# Patient Record
Sex: Male | Born: 1980 | Hispanic: No | Marital: Married | Smoking: Never smoker
Health system: Southern US, Community
[De-identification: ages and names within clinical notes are randomized; demographics above are authoritative.]

---

## 2000-09-19 ENCOUNTER — Emergency Department (HOSPITAL_COMMUNITY): Admission: EM | Admit: 2000-09-19 | Discharge: 2000-09-19 | Payer: Self-pay | Admitting: Emergency Medicine

## 2000-09-19 ENCOUNTER — Encounter: Payer: Self-pay | Admitting: *Deleted

## 2000-09-19 ENCOUNTER — Encounter: Payer: Self-pay | Admitting: Emergency Medicine

## 2003-10-11 ENCOUNTER — Emergency Department (HOSPITAL_COMMUNITY): Admission: EM | Admit: 2003-10-11 | Discharge: 2003-10-11 | Payer: Self-pay | Admitting: Family Medicine

## 2015-02-01 ENCOUNTER — Emergency Department (INDEPENDENT_AMBULATORY_CARE_PROVIDER_SITE_OTHER): Payer: BLUE CROSS/BLUE SHIELD

## 2015-02-01 ENCOUNTER — Encounter (HOSPITAL_COMMUNITY): Payer: Self-pay | Admitting: Emergency Medicine

## 2015-02-01 ENCOUNTER — Emergency Department (INDEPENDENT_AMBULATORY_CARE_PROVIDER_SITE_OTHER)
Admission: EM | Admit: 2015-02-01 | Discharge: 2015-02-01 | Disposition: A | Discharge status: Home / Self Care | Payer: BLUE CROSS/BLUE SHIELD | Source: Home / Self Care | Attending: Emergency Medicine | Admitting: Emergency Medicine

## 2015-02-01 DIAGNOSIS — S8990XA Unspecified injury of unspecified lower leg, initial encounter: Secondary | ICD-10-CM | POA: Diagnosis not present

## 2015-02-01 DIAGNOSIS — J309 Allergic rhinitis, unspecified: Secondary | ICD-10-CM

## 2015-02-01 DIAGNOSIS — S8991XA Unspecified injury of right lower leg, initial encounter: Secondary | ICD-10-CM

## 2015-02-01 DIAGNOSIS — S99929A Unspecified injury of unspecified foot, initial encounter: Secondary | ICD-10-CM

## 2015-02-01 DIAGNOSIS — S99919A Unspecified injury of unspecified ankle, initial encounter: Secondary | ICD-10-CM

## 2015-02-01 MED ORDER — MOMETASONE FUROATE 50 MCG/ACT NA SUSP: 2.0000 | Freq: Every day | NASAL | Status: AC

## 2015-02-01 MED ORDER — TRAMADOL HCL 50 MG PO TABS: 50.0000 mg | ORAL_TABLET | Freq: Four times a day (QID) | ORAL | Status: AC | PRN

## 2015-02-01 MED ORDER — DESLORATADINE 5 MG PO TABS: 5.0000 mg | ORAL_TABLET | Freq: Every day | ORAL | Status: AC

## 2015-02-01 NOTE — ED Notes (Signed)
Knee sprain and sinus complaints.  Onset yesterday

## 2015-02-01 NOTE — Discharge Instructions (Signed)
You have some sort of injury to your right knee that is causing swelling in the knee. With the knee immobilizer if you are going to be up and moving around. Use crutches and do not put weight on the right leg. If you are going to be sitting, you can remove the knee immobilizer. Apply ice to help with the swelling. Do gentle flexion and extension to keep your knee loose. You can take ibuprofen 3 times a day to help with the swelling. Use the tramadol every 6-8 hours as needed for severe pain. Do not drive while taking this medicine. Please call Dr. Eliberto IvoryBlackman's office tomorrow morning to set up an appointment in the next 1-2 weeks.  Start the Nasonex and desloratadine for your sinus symptoms. You should see improvement in the next 2-3 days.

## 2015-02-01 NOTE — ED Provider Notes (Signed)
CSN: [id number]     Arrival date & time 02/01/15  1350 History   First MD Initiated Contact with Patient 02/01/15 1440     Chief Complaint  Patient presents with  . Knee Pain   (Consider location/radiation/quality/duration/timing/severity/associated sxs/prior Treatment) HPI  He is a 34 year old man here for evaluation of right knee pain. He states he was playing soccer yesterday when he jumped. When he landed he felt something pop in his right knee. He had immediate pain and swelling. There is also some bruising on the medial aspect of the knee. He denies any locking or clicking. No looseness of the joint. He states he can walk on it, but it is very painful.  He has applied ice which helped somewhat the swelling. He has also taken a little bit of ibuprofen.  He also mentions some trouble with his sinuses. He states every year he gets sinus pressure and congestion in the summer and fall. Is associated with a headache. He has had sinus pressure for the last several weeks. He does not take any medication regularly for this. He will take ibuprofen for the headache, which does help. He was seen at another urgent care a year ago for this and treated with a course of antibiotics which he states did help.  No fevers or chills.  History reviewed. No pertinent past medical history. History reviewed. No pertinent past surgical history. No family history on file. Social History  Substance Use Topics  . Smoking status: Never Smoker   . Smokeless tobacco: None  . Alcohol Use: No    Review of Systems As in history of present illness Allergies  Review of patient's allergies indicates no known allergies.  Home Medications   Prior to Admission medications   Medication Sig Start Date End Date Taking? Authorizing Provider  ibuprofen (ADVIL,MOTRIN) 200 MG tablet Take 200 mg by mouth every 6 (six) hours as needed.   Yes Historical Provider, MD  desloratadine (CLARINEX) 5 MG tablet Take 1 tablet (5 mg  total) by mouth daily. 02/01/15   Charm RingsErin J Cary Wilford, MD  mometasone (NASONEX) 50 MCG/ACT nasal spray Place 2 sprays into the nose daily. 02/01/15   Charm RingsErin J Princeston Blizzard, MD  traMADol (ULTRAM) 50 MG tablet Take 1 tablet (50 mg total) by mouth every 6 (six) hours as needed. 02/01/15   Charm RingsErin J Yifan Auker, MD   Meds Ordered and Administered this Visit  Medications - No data to display  BP 137/81 mmHg  Pulse 57  Temp(Src) 98.4 F (36.9 C) (Oral)  Resp 18  SpO2 100% No data found.   Physical Exam  Constitutional: He is oriented to person, place, and time. He appears well-developed and well-nourished. No distress.  HENT:  Mouth/Throat: No oropharyngeal exudate.  Small amount of clear postnasal drainage seen. Nasal mucosa is boggy. Mild sinus tenderness.  Neck: Neck supple.  Cardiovascular: Normal rate.   Pulmonary/Chest: Effort normal.  Musculoskeletal:  Right knee: There is significant swelling to the right knee. There is bruising at the medial aspect of the knee. No appreciable joint laxity. MCL, LCL, and anterior cruciate ligament all appear to be intact. Negative McMurray's.  Neurological: He is alert and oriented to person, place, and time.    ED Course  Procedures (including critical care time)  Labs Review Labs Reviewed - No data to display  Imaging Review Dg Knee Complete 4 Views Right  02/01/2015  CLINICAL DATA:  Initial encounter for Pt was playing scoccer yesterday and came down on his  leg wrong and heard a pop in his rt knee, swelling and pain all around the knee EXAM: RIGHT KNEE - COMPLETE 4+ VIEW COMPARISON:  None. FINDINGS: No acute fracture or dislocation. Small suprapatellar joint effusion. Joint spaces are maintained for age. IMPRESSION: Small suprapatellar joint effusion.  No acute osseous abnormality. Electronically Signed   By: Jeronimo Greaves M.D.   On: 02/01/2015 15:17     MDM   1. Right knee injury, initial encounter   2. Allergic sinusitis    Spoke with Dr. Magnus Ivan in  orthopedic surgery who will follow up with the patient. Knee immobilizer and crutches when up and around. Discussed range of motion exercises several times a day. Recommended ice and ibuprofen to help with the swelling. Prescription for tramadol given for severe pain.  We'll treat allergic sinusitis with Nasonex and desloratadine as these are preferred by his insurance company. Follow-up as needed.    Charm Rings, MD 02/01/15 1539

## 2015-02-05 ENCOUNTER — Other Ambulatory Visit: Payer: Self-pay | Admitting: Orthopaedic Surgery

## 2015-02-05 DIAGNOSIS — M25561 Pain in right knee: Secondary | ICD-10-CM

## 2015-02-17 ENCOUNTER — Other Ambulatory Visit: Payer: Self-pay

## 2015-07-21 ENCOUNTER — Other Ambulatory Visit (INDEPENDENT_AMBULATORY_CARE_PROVIDER_SITE_OTHER): Payer: Self-pay | Admitting: Otolaryngology

## 2015-07-21 DIAGNOSIS — J0101 Acute recurrent maxillary sinusitis: Secondary | ICD-10-CM

## 2015-08-03 ENCOUNTER — Ambulatory Visit
Admission: RE | Admit: 2015-08-03 | Discharge: 2015-08-03 | Disposition: A | Discharge status: Home / Self Care | Payer: BLUE CROSS/BLUE SHIELD | Source: Ambulatory Visit | Attending: Otolaryngology | Admitting: Otolaryngology

## 2015-08-03 DIAGNOSIS — J0101 Acute recurrent maxillary sinusitis: Secondary | ICD-10-CM

## 2017-04-07 ENCOUNTER — Telehealth: Payer: Self-pay | Admitting: Rheumatology

## 2017-04-07 NOTE — Telephone Encounter (Signed)
Patient is calling concerning Prednisone prescription.  Stated she received a 5 day supply and was told she would be taking it for 6 weeks.  Contact [phone]  Please advise

## 2017-04-07 NOTE — Telephone Encounter (Signed)
Patient does not Dr. Corliss Skainseveshwar

## 2017-04-12 NOTE — Telephone Encounter (Signed)
ERROR, this message was not for this patient, I have entered in the correct chart and forwarded.

## 2017-06-20 IMAGING — CT CT MAXILLOFACIAL W/O CM
1 series · 15 of 30 positions shown, 19 images · non-contrast
Comparison: None.

CLINICAL DATA: 34-year-old male with right greater than left facial
pain and pressure. Recurrent maxillary sinusitis. Preoperative
surgical planning. Initial encounter.

EXAM:
CT MAXILLOFACIAL WITHOUT CONTRAST
TECHNIQUE: Multidetector CT imaging of the maxillofacial structures was
performed. Multiplanar CT image reconstructions were also generated.
A small metallic BB was placed on the right temple in order to
reliably differentiate right from left.

[Series 4: soft tissue · axial · 0.45mm/px · z∈[-264,-103]mm · 15 of 173 slices shown, 19 images]
[im 6/173  brain]
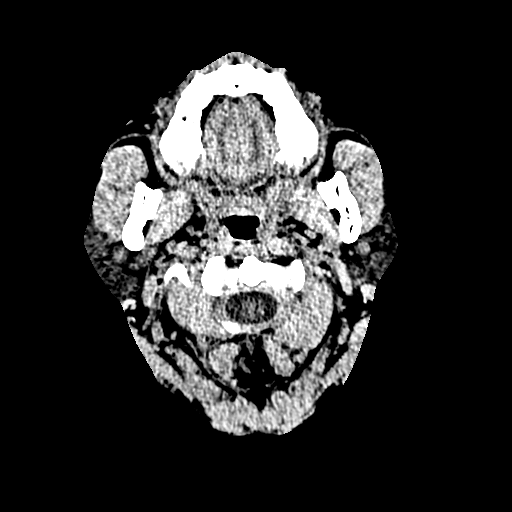
[im 6/173  bone]
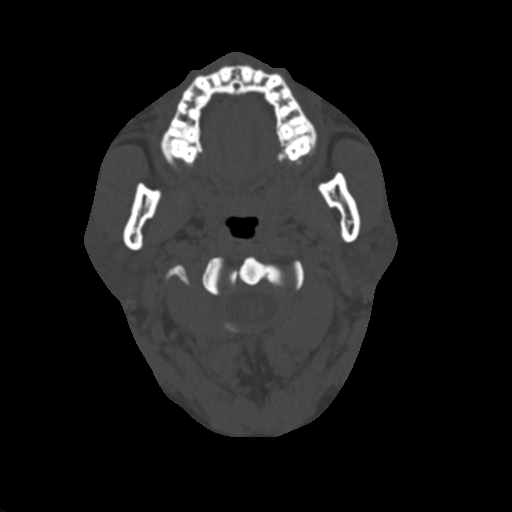
[im 18/173  bone]
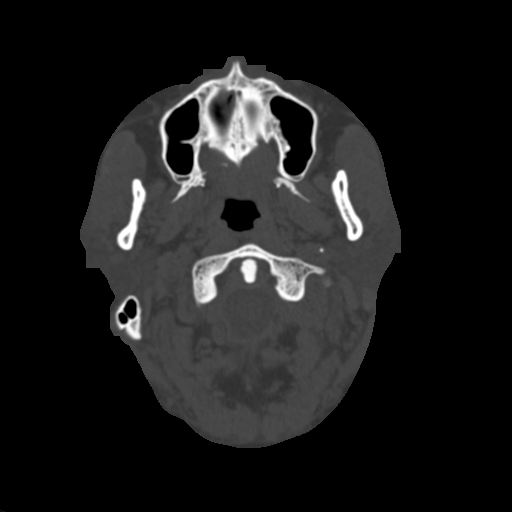
[im 30/173  bone]
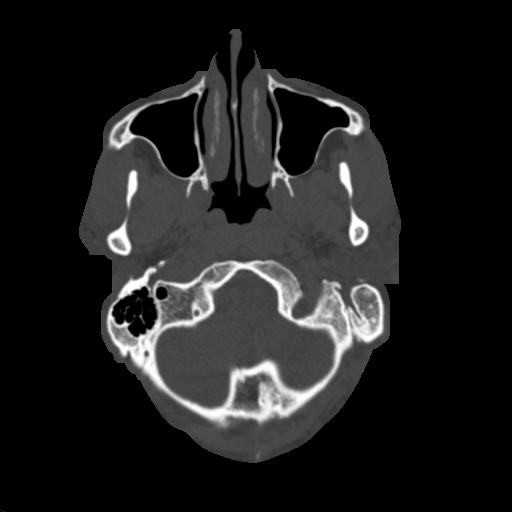
[im 42/173  bone]
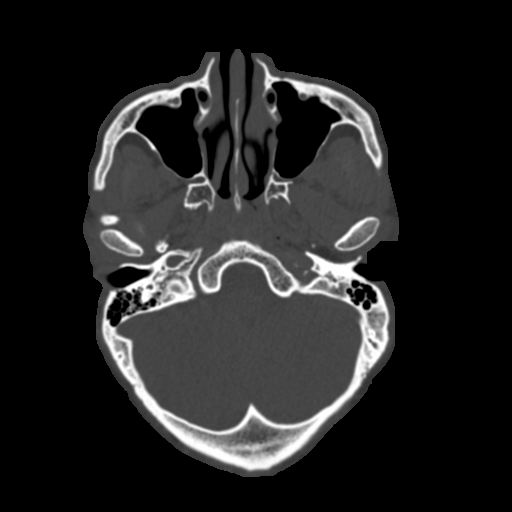
[im 54/173  brain]
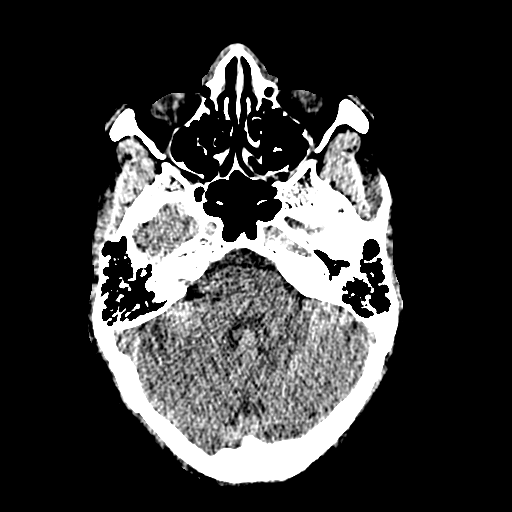
[im 54/173  bone]
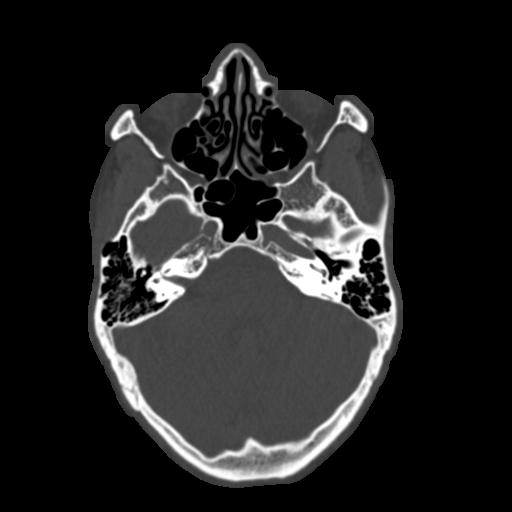
[im 66/173  bone]
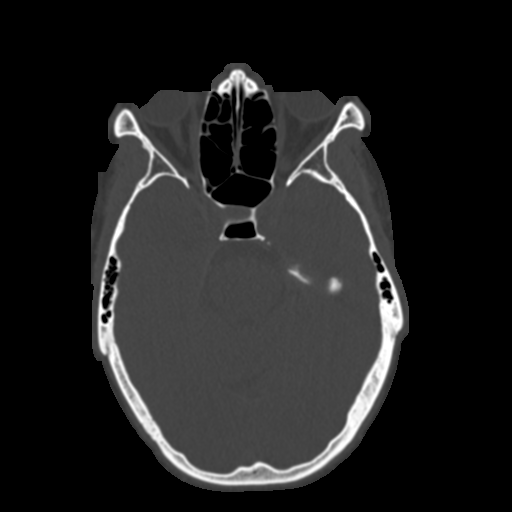
[im 78/173  bone]
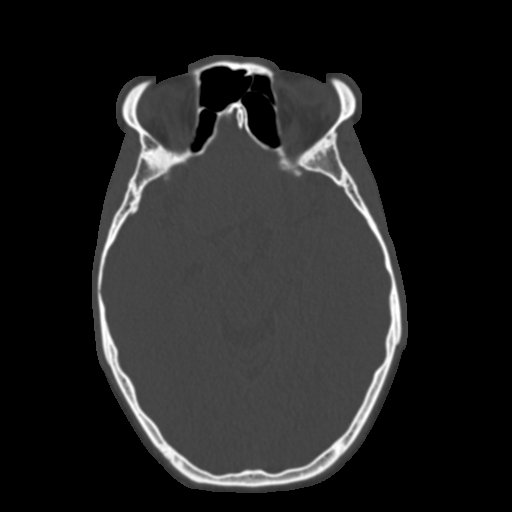
[im 89/173  bone]
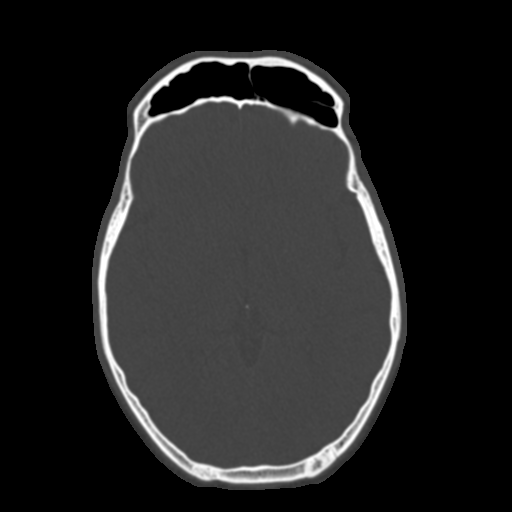
[im 95/173  brain]
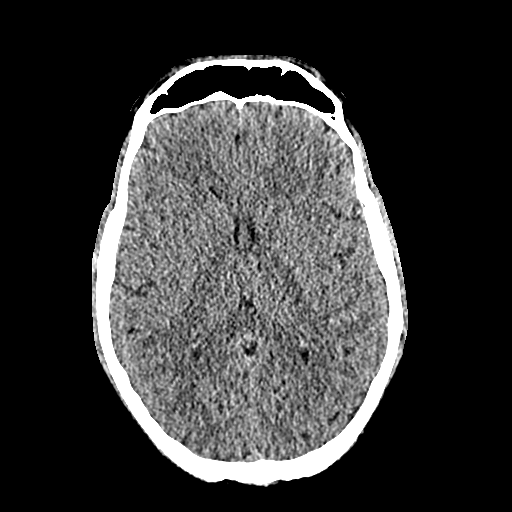
[im 95/173  bone]
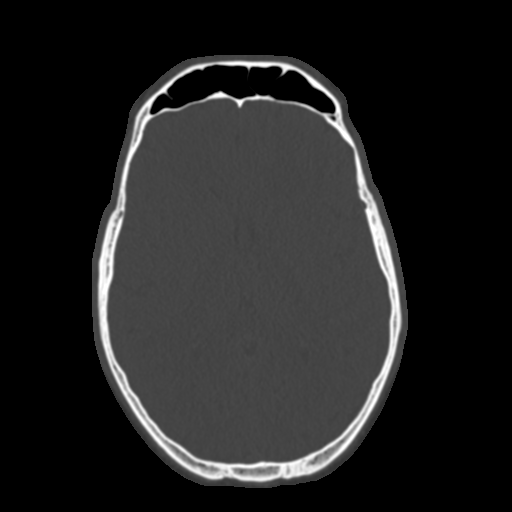
[im 107/173  bone]
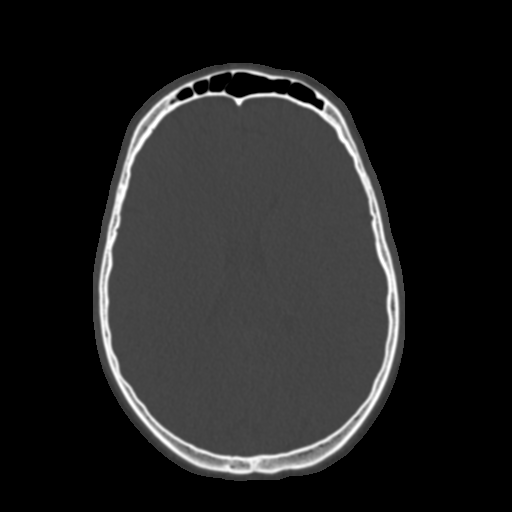
[im 119/173  bone]
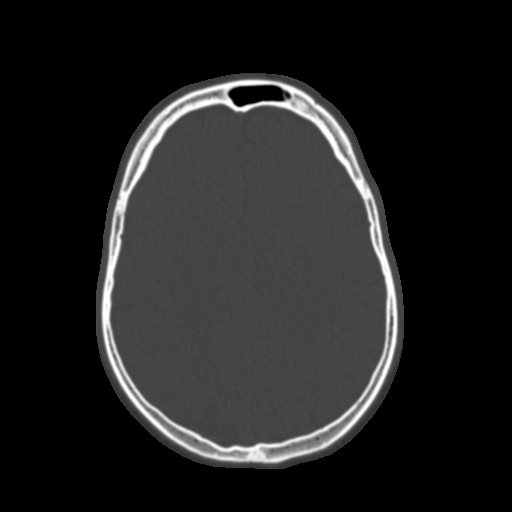
[im 131/173  bone]
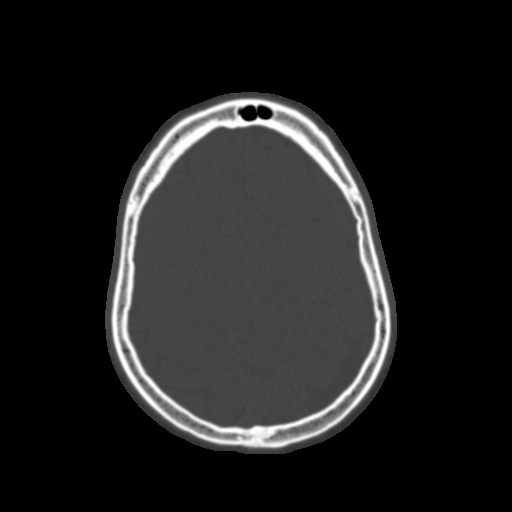
[im 143/173  brain]
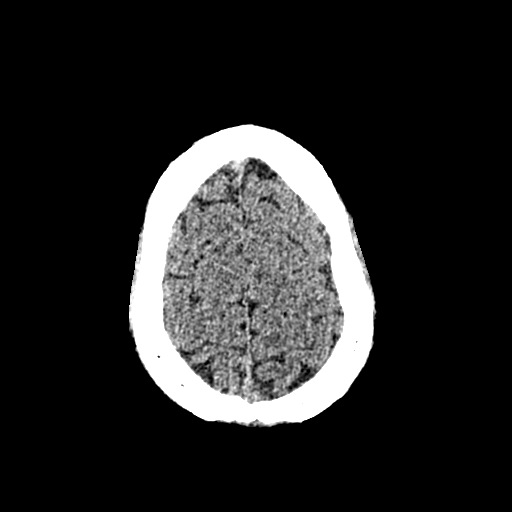
[im 143/173  bone]
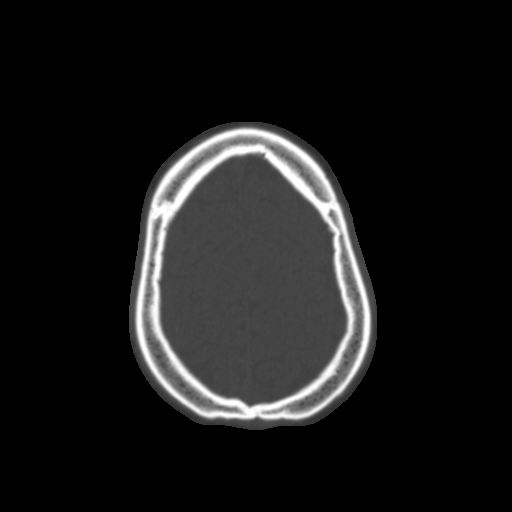
[im 155/173  bone]
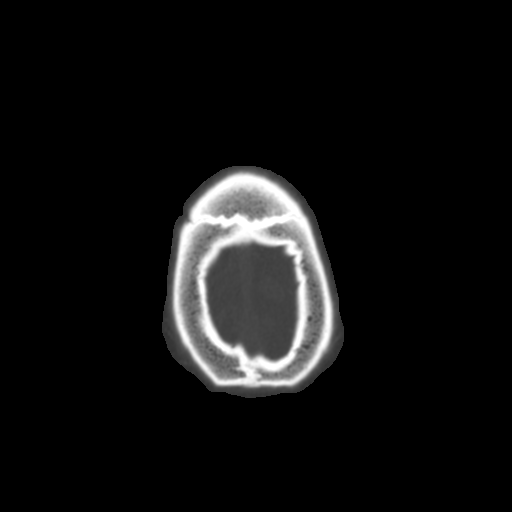
[im 167/173  bone]
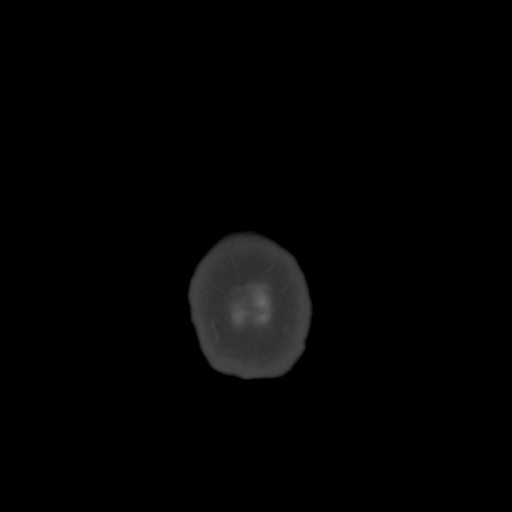

[15 of 30 positions shown; findings below may reference images not displayed]

FINDINGS: Cerebral volume is within normal limits. Negative visualized
noncontrast brain parenchyma.

Negative orbit and scalp soft tissues. Negative visualized
noncontrast deep soft tissue spaces of the face.

Bilateral tympanic cavities and mastoids are clear.

Both sphenoid sinuses are clear. The left sphenoid is larger, but
both sphenoid sinuses are hyperplastic, note anterior clinoid
process pneumatization on the right (coronal image 39).

Hyperplastic appearing bilateral ethmoid air cells are clear.

Hyperplastic frontal sinuses are clear.
Mildly hyperplastic maxillary sinuses are clear aside from minimal
mucosal thickening at both maxillary ostia, with preserved patency
of the OMCs (coronal image 22).

Rightward nasal septal deviation. Symmetric nasal cavity mucosal
thickening. Trace retained secretions in the left nasal cavity.

No osseous abnormality identified.
IMPRESSION: 1. Symmetric appearing nasal cavity mucosal thickening, with trace
retained secretions on the left, suggesting rhinitis.
2. Paranasal sinuses are hyperplastic and clear.

## 2021-06-11 ENCOUNTER — Other Ambulatory Visit: Payer: Self-pay | Admitting: Family Medicine

## 2021-06-11 DIAGNOSIS — N50819 Testicular pain, unspecified: Secondary | ICD-10-CM

## 2021-06-15 ENCOUNTER — Ambulatory Visit
Admission: RE | Admit: 2021-06-15 | Discharge: 2021-06-15 | Disposition: A | Discharge status: Home / Self Care | Payer: Managed Care, Other (non HMO) | Source: Ambulatory Visit | Attending: Family Medicine | Admitting: Family Medicine

## 2021-06-15 DIAGNOSIS — N50819 Testicular pain, unspecified: Secondary | ICD-10-CM

## 2023-05-03 IMAGING — US US SCROTUM W/ DOPPLER COMPLETE
1 series · 13 of 25 positions shown · non-contrast
Comparison: None.

CLINICAL DATA: Left testicular pain

EXAM:
SCROTAL ULTRASOUND
DOPPLER ULTRASOUND OF THE TESTICLES
TECHNIQUE: Complete ultrasound examination of the testicles, epididymis, and
other scrotal structures was performed. Color and spectral Doppler
ultrasound were also utilized to evaluate blood flow to the
testicles.

[Series 1: us scrotum w/ doppler complete · 0.05mm/px · 13 of 47 slices shown]
[im 1/47]
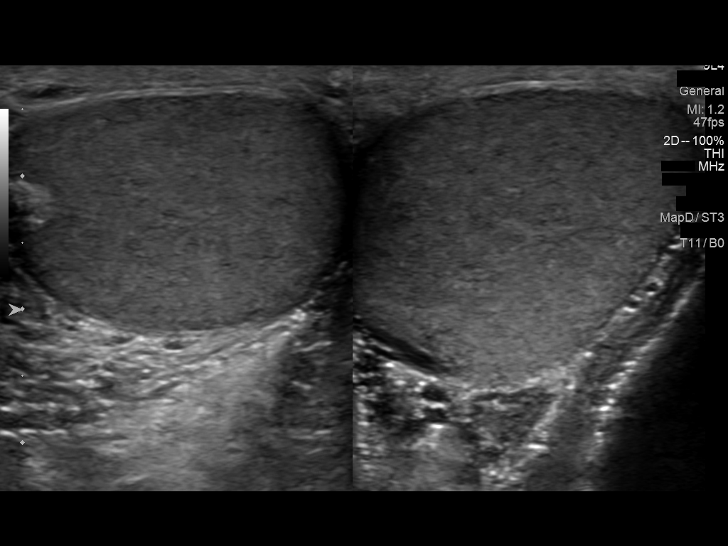
[im 4/47]
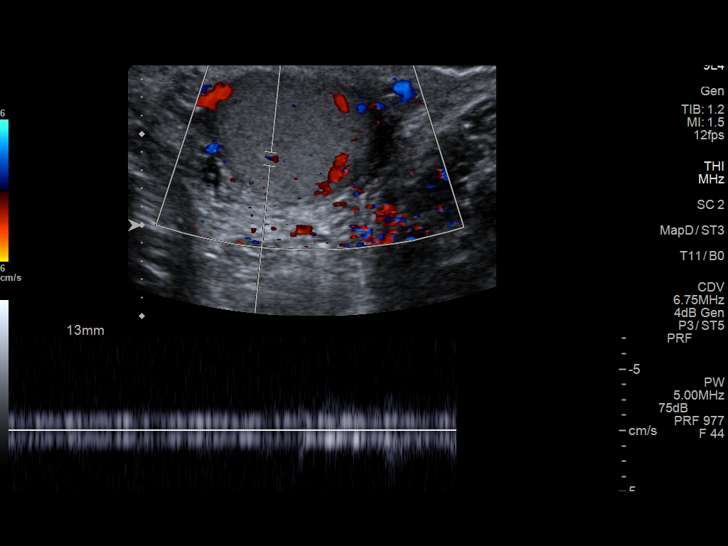
[im 8/47]
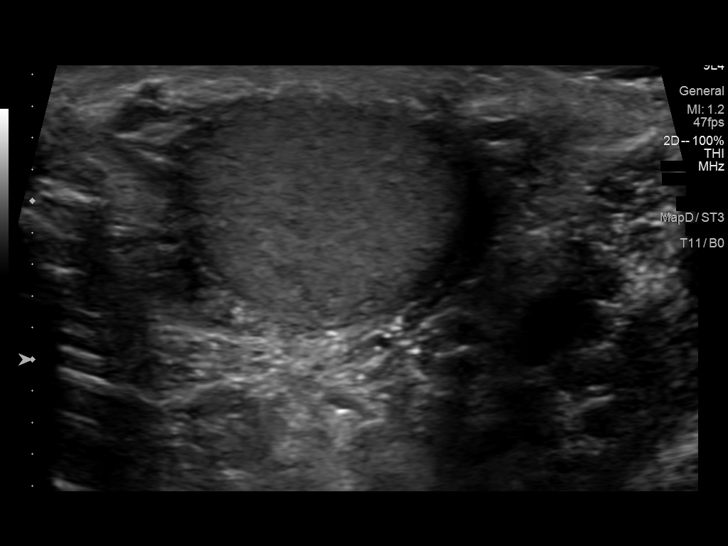
[im 12/47]
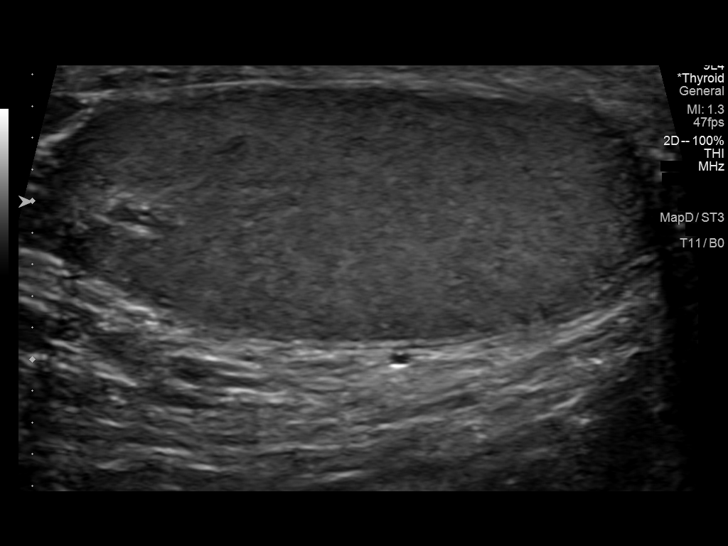
[im 16/47]
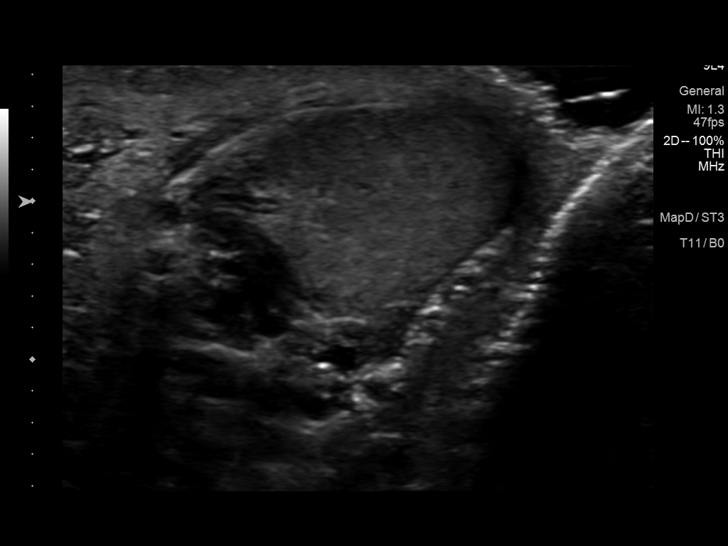
[im 20/47]
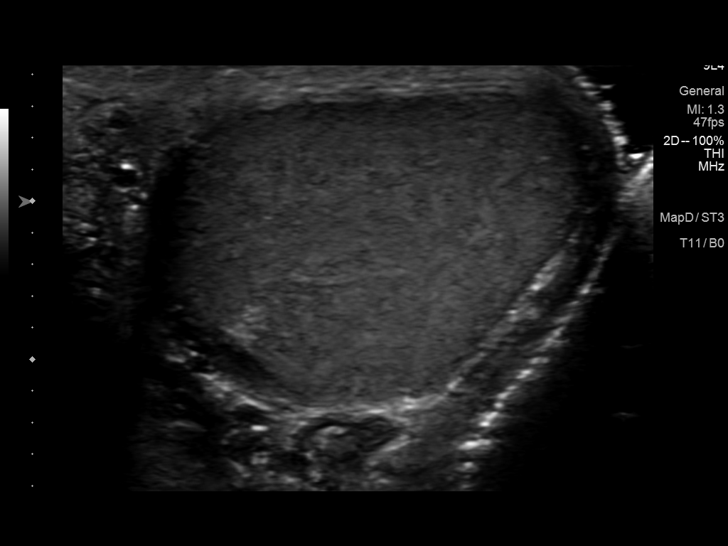
[im 24/47]
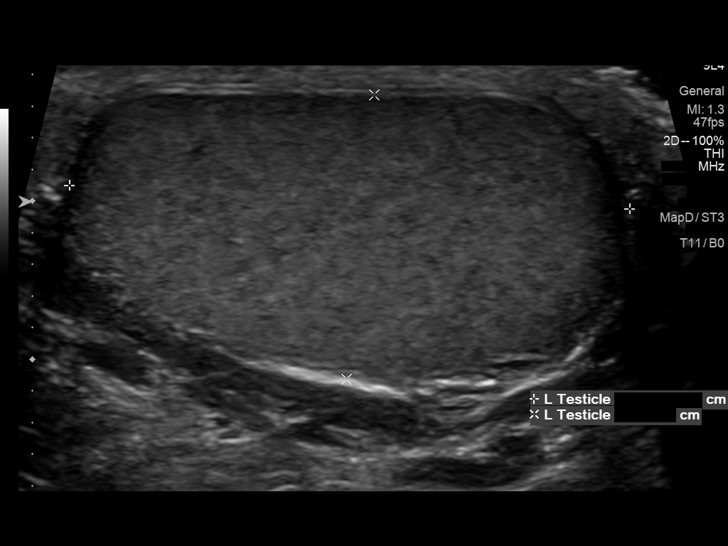
[im 27/47]
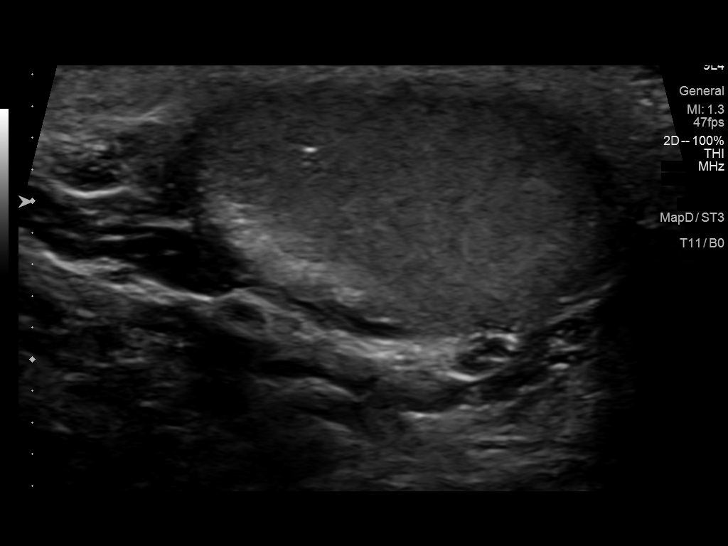
[im 31/47]
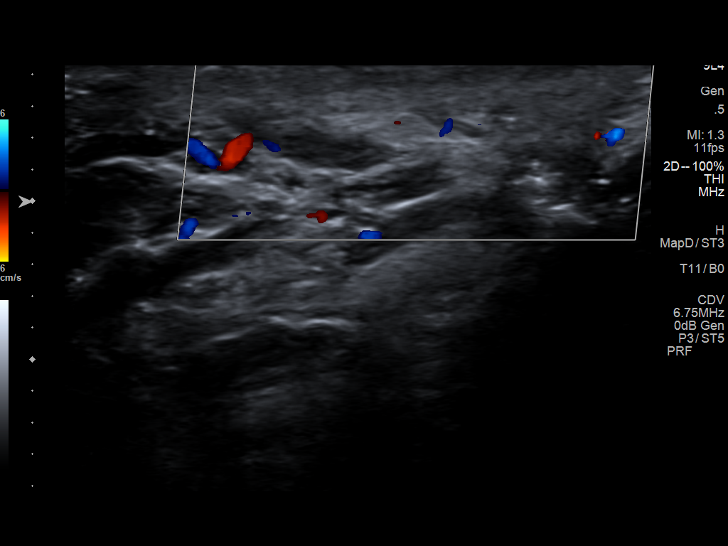
[im 35/47]
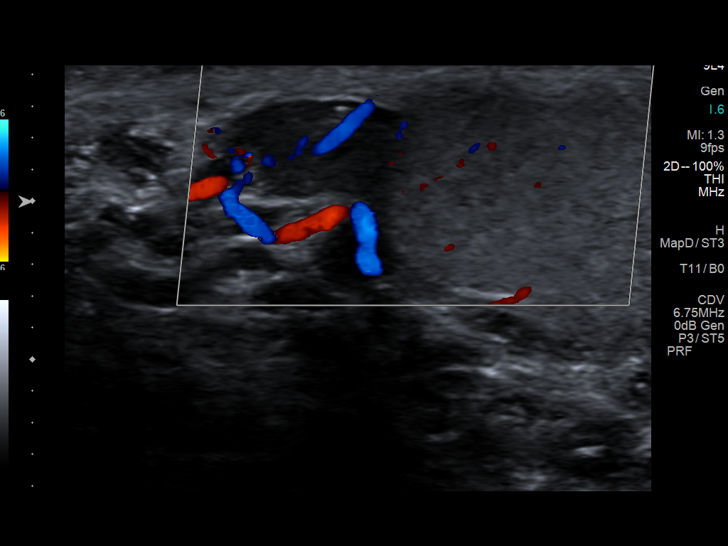
[im 39/47]
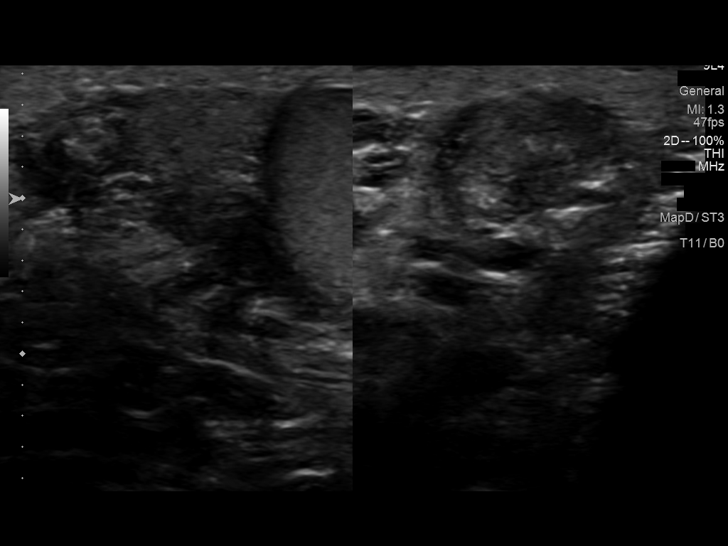
[im 43/47]
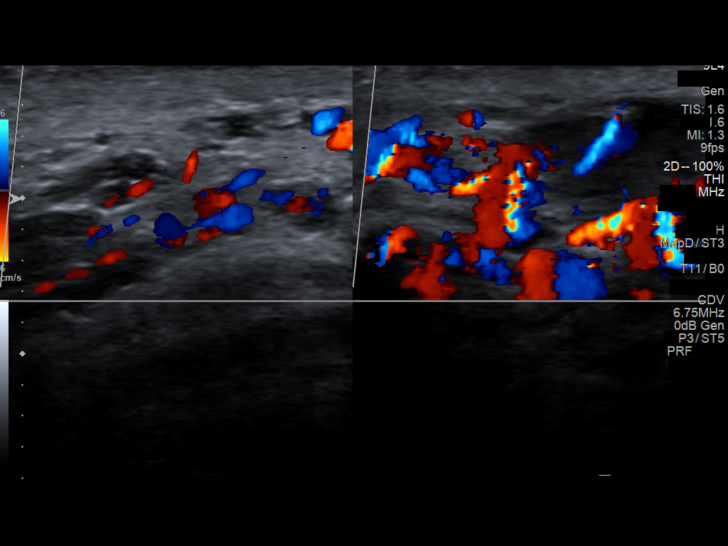
[im 47/47]
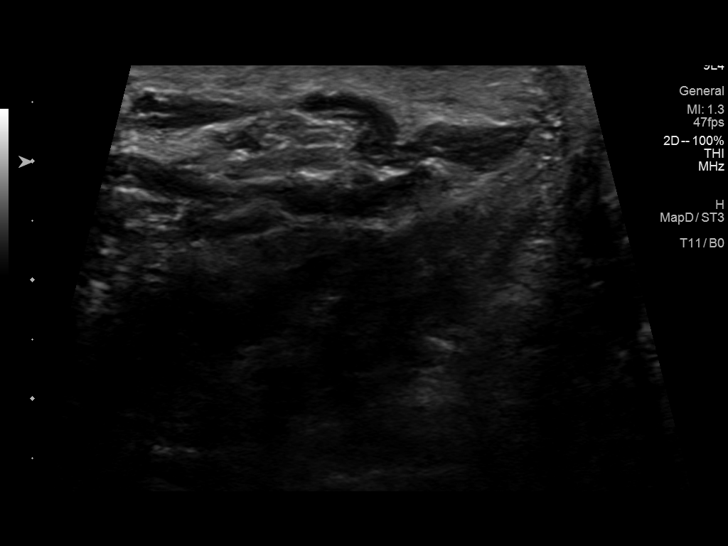

[13 of 25 positions shown; findings below may reference images not displayed]

FINDINGS: Right testicle

Measurements: 3.7 x 1.6 x 2.6 cm. No mass or microlithiasis
visualized.

Left testicle

Measurements: 3.5 x 1.8 x 2.5 cm. Few small scattered
microcalcifications. No mass.

Right epididymis:  Normal in size and appearance.

Left epididymis:  Normal in size and appearance.

Hydrocele:  None visualized.

Varicocele:  None visualized.

Pulsed Doppler interrogation of both testes demonstrates normal low
resistance arterial and venous waveforms bilaterally.
IMPRESSION: No testicular mass or evidence of torsion.

Left testicular microlithiasis. Current literature suggests that
testicular microlithiasis is not a significant independent risk
factor for development of testicular carcinoma, and that follow up
imaging is not warranted in the absence of other risk factors.
Monthly testicular self-examination and annual physical exams are
considered appropriate surveillance. If patient has other risk
factors for testicular carcinoma, then referral to Urology should be
considered. (Reference: Jim, et al.: A 5-Year Follow up Study
of Asymptomatic Men with Testicular Microlithiasis. J Urol 7665;
